# Patient Record
Sex: Female | Born: 1985 | Race: White | Hispanic: No | Marital: Single | Smoking: Never smoker
Health system: Southern US, Community
[De-identification: ages and names within clinical notes are randomized; demographics above are authoritative.]

## PROBLEM LIST (undated history)

## (undated) DIAGNOSIS — J45909 Unspecified asthma, uncomplicated: Secondary | ICD-10-CM

## (undated) DIAGNOSIS — R569 Unspecified convulsions: Secondary | ICD-10-CM

## (undated) DIAGNOSIS — G35 Multiple sclerosis: Secondary | ICD-10-CM

## (undated) DIAGNOSIS — Z8669 Personal history of other diseases of the nervous system and sense organs: Secondary | ICD-10-CM

## (undated) HISTORY — DX: Unspecified convulsions: R56.9

## (undated) HISTORY — DX: Unspecified asthma, uncomplicated: J45.909

## (undated) HISTORY — DX: Personal history of other diseases of the nervous system and sense organs: Z86.69

## (undated) HISTORY — DX: Multiple sclerosis: G35

---

## 2013-01-27 ENCOUNTER — Encounter: Payer: Self-pay | Admitting: Neurology

## 2013-01-29 ENCOUNTER — Encounter: Payer: Self-pay | Admitting: Neurology

## 2013-01-29 ENCOUNTER — Ambulatory Visit (INDEPENDENT_AMBULATORY_CARE_PROVIDER_SITE_OTHER): Payer: BC Managed Care – PPO | Admitting: Neurology

## 2013-01-29 ENCOUNTER — Encounter (INDEPENDENT_AMBULATORY_CARE_PROVIDER_SITE_OTHER): Payer: Self-pay

## 2013-01-29 VITALS — BP 114/68 | HR 72 | Ht 63.5 in | Wt 127.0 lb

## 2013-01-29 DIAGNOSIS — R569 Unspecified convulsions: Secondary | ICD-10-CM

## 2013-01-29 DIAGNOSIS — Z5181 Encounter for therapeutic drug level monitoring: Secondary | ICD-10-CM

## 2013-01-29 DIAGNOSIS — G35 Multiple sclerosis: Secondary | ICD-10-CM

## 2013-01-29 NOTE — Progress Notes (Signed)
Reason for visit: Multiple sclerosis  Allison Silva is a 27 y.o. female  History of present illness:  Allison Silva is a 27 year old right-handed white female with a history of multiple sclerosis that was diagnosed in 2010. The patient had been having some symptoms in retrospect since her freshman year in high school. The patient presented with right arm and right leg numbness. The patient was found to have multiple white matter lesions that were enhancing in the brain, with black holes. The patient was initially placed on Rebif, but she did not tolerate the medication. The patient subsequently was placed on Aubagio, and the patient has been on this medication until the summer of 2014. The patient ran out of medication, and she has been off of all disease modifying agents since that time. The patient is not using birth control, and she was not aware of any problems with pregnancy while on Aubagio. The patient has done fairly well over the last 2 years. In January 2013, the patient had a seizure event that was felt associated with a relapse of her multiple sclerosis. The patient had been on Ampyra, but she had stopped that medication 6 months prior to her seizure. The patient was placed on Keppra, but once again, the patient stopped the Keppra sometime within the last year. The patient has not had any seizure recurrence, and she indicates that she is operating a motor vehicle. The patient indicates that in the past, she has had an episode of right optic neuritis. The patient does have some fatigue that worsens with exercise or overheating. The patient otherwise is able to do any activities that she wishes. Within the last month, the patient has had some mild numbness involving the right fourth and fifth fingers. The patient indicates that when she had her seizure, she was somewhat confused going into the seizure, and then had a witnessed generalized tonic-clonic event. The patient went to the hospital,  and had a second event in the hospital. The last MRI of the brain was done in January 2013. At that time, the patient had multiple oval and round lesions with an enhancing lesion in the left parietal lobe with evidence of black holes. The patient also had spinal cord lesions. An EEG study done in January 2013 showed focal slow-wave activity over the right posterior temporal region. The patient has recently moved to this area, and she comes to this office for an evaluation. The patient is unaware that she has ever been checked for the JC virus antibody.  Past Medical History  Diagnosis Date  . Multiple sclerosis   . Seizures   . Asthma   . History of optic neuritis     Right    History reviewed. No pertinent past surgical history.  History reviewed. No pertinent family history.  Social history:  reports that she has never smoked. She has never used smokeless tobacco. She reports that she drinks alcohol. She reports that she does not use illicit drugs.  Medications:  No current outpatient prescriptions on file prior to visit.   No current facility-administered medications on file prior to visit.     No Known Allergies  ROS:  Out of a complete 14 system review of symptoms, the patient complains only of the following symptoms, and all other reviewed systems are negative.  Fatigue, palpitations Feeling hot, cold, increased thirst Memory loss, numbness Sleepiness  Blood pressure 114/68, pulse 72, height 5' 3.5" (1.613 m), weight 127 lb (57.607 kg).  Physical  Exam  General: The patient is alert and cooperative at the time of the examination.  Head: Pupils are equal, round, and reactive to light. Discs are flat bilaterally.  Neck: The neck is supple, no carotid bruits are noted.  Respiratory: The respiratory examination is clear.  Cardiovascular: The cardiovascular examination reveals a regular rate and rhythm, no obvious murmurs or rubs are noted.  Skin: Extremities are  without significant edema.  Neurologic Exam  Mental status: The patient is alert and oriented x 3 at the time of the examination.  Cranial nerves: Facial symmetry is present. There is good sensation of the face to pinprick and soft touch bilaterally. The strength of the facial muscles and the muscles to head turning and shoulder shrug are normal bilaterally. Speech is well enunciated, no aphasia or dysarthria is noted. Extraocular movements are full. Visual fields are full.  Motor: The motor testing reveals 5 over 5 strength of all 4 extremities. Good symmetric motor tone is noted throughout.  Sensory: Sensory testing is intact to pinprick, soft touch, vibration sensation, and position sense on all 4 extremities. No evidence of extinction is noted.  Coordination: Cerebellar testing reveals good finger-nose-finger and heel-to-shin bilaterally.  Gait and station: Gait is normal. Tandem gait is normal. Romberg is negative. No drift is seen.  Reflexes: Deep tendon reflexes are symmetric and normal bilaterally. Toes are downgoing bilaterally.   Assessment/Plan:  1. Multiple sclerosis  2. History of seizures  On prior examinations, the patient has had significant abnormalities by MRI, but her clinical examination is relatively unremarkable. MRI studies had shown evidence of significant disease activity, with "black holes", which offers a risk for disability and dementia in the future. The patient should be on a more aggressive therapy. Aubagio should not be used in an individual in the childbearing age unless absolutely necessary. Aubagio does not represent an aggressive therapy for multiple sclerosis. The patient will be considered for Gilenya or for Tysabri. Blood work will be done today. The patient will followup in 4-6 weeks. MRI evaluation of the brain and cervical spine will be done with and without contrast. EEG evaluation will be done. For now, the patient will remain off of the Keppra as  she has done well without medical therapy. If any further seizure-type events occur, the patient is to contact me immediately. The patient is on vitamin D therapy.  Marlan Palau MD 01/30/2013 8:36 AM  [facility] [address] [place], [place] [address]  Phone [phone] Fax [phone]

## 2013-01-29 NOTE — Patient Instructions (Signed)
Multiple Sclerosis Multiple sclerosis (MS) is a disease of the central nervous system. Its cause is unknown. It is more common in the northern states than in the southern states. There is a higher incidence of MS in women. There is a wide variation in the symptoms (problems) of MS. This is because of the many different ways it affects the central nervous system. It often comes on in episodes or attacks. These attacks may last weeks to months. There may be long periods of nearly no problems between attacks. The main symptoms include visual problems (associated with eye pain), numbness, weakness, and paralysis in extremities (arms/hands and legs/feet). There may also be tremors and problems with balance and walking. The age when MS starts is variable. Advances in medicine continue to improve the treatment of this illness. There is no known cure for MS but there are medications that help. MS is not an inherited illness, although your risk of getting this disease is higher if you have a relative with MS. The best radiologic (x-ray) study for MS is an MRI (magnetic resonance imaging). There are medications available to decrease the number and frequency of attacks. SYMPTOMS  The symptoms of MS are caused by loss of insulation (myelin) of the nerves of the brain. When this happens, brain signals do not get transmitted properly or may not get transmitted at all. Some of the problems caused by this include:   Numbness.  Weakness.  Paralysis in extremities.  Visual problems, eye pain.  Balance problems.  Tremors. DIAGNOSIS  Your caregiver can do studies on you to make this diagnosis. This may include specialized X-rays and spinal fluid studies. HOME CARE INSTRUCTIONS   Take medications as directed by your caregiver. Baclofen is a drug commonly used to reduce muscle spasticity. Steroids are often used for short term relief.  Exercise as directed.  Use physical and occupational therapy as directed by  your caregiver. Careful attention to this medical care can help avoid depression.  See your caregiver if you begin to have problems with depression. This is a common problem in MS. Patients often continue to work many years after the diagnosis of MS. Document Released: 01/26/2000 Document Revised: 04/22/2011 Document Reviewed: 09/03/2006 Exit[employer] Patient Information 2014 [employer].  

## 2013-02-02 LAB — CBC WITH DIFFERENTIAL
Basophils Absolute: 0.1 10*3/uL (ref 0.0–0.2)
Eos: 1 %
Eosinophils Absolute: 0 10*3/uL (ref 0.0–0.4)
HCT: 37.1 % (ref 34.0–46.6)
Immature Grans (Abs): 0 10*3/uL (ref 0.0–0.1)
Immature Granulocytes: 0 %
Lymphs: 28 %
Monocytes: 7 %
Neutrophils Absolute: 3.5 10*3/uL (ref 1.4–7.0)
Neutrophils Relative %: 63 %
Platelets: 255 10*3/uL (ref 150–379)
RDW: 15.1 % (ref 12.3–15.4)
WBC: 5.6 10*3/uL (ref 3.4–10.8)

## 2013-02-02 LAB — COMPREHENSIVE METABOLIC PANEL

## 2013-02-02 LAB — VARICELLA ZOSTER ANTIBODY, IGG

## 2013-02-05 ENCOUNTER — Telehealth: Payer: Self-pay | Admitting: Neurology

## 2013-02-05 NOTE — Telephone Encounter (Signed)
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

## 2013-02-15 ENCOUNTER — Ambulatory Visit (INDEPENDENT_AMBULATORY_CARE_PROVIDER_SITE_OTHER): Payer: BC Managed Care – PPO | Admitting: Radiology

## 2013-02-15 ENCOUNTER — Telehealth: Payer: Self-pay | Admitting: Neurology

## 2013-02-15 ENCOUNTER — Other Ambulatory Visit: Payer: Self-pay | Admitting: Neurology

## 2013-02-15 DIAGNOSIS — R569 Unspecified convulsions: Secondary | ICD-10-CM

## 2013-02-15 NOTE — Procedures (Signed)
    History:  Allison Silva is a 28 year old patient with a history of multiple sclerosis diagnosed in 2010. In January 2013, the patient had a seizure event that was felt to be associated with an MS exacerbation. The patient was placed on Keppra, but she has stopped the medication within the last year. No further episodes have been noted. The patient is being reevaluated for the seizures.  This is a routine EEG. No skull defects are noted. Medications include albuterol and vitamin D.   EEG classification: Normal awake and asleep  Description of the recording: The background rhythms of this recording consists of a fairly well modulated medium amplitude background activity of 11 Hz. As the record progresses, the patient initially is in the waking state, but appears to enter the early stage II sleep during the recording, with rudimentary sleep spindles and vertex sharp wave activity seen. During the wakeful state, photic stimulation is performed, and this results in a bilateral and symmetric photic driving response. Hyperventilation was not performed. At no time during the recording does there appear to be evidence of spike or spike wave discharges or evidence of focal slowing. EKG monitor shows no evidence of cardiac rhythm abnormalities with a heart rate of 72.  Impression: This is a normal EEG recording in the waking and sleeping state. No evidence of ictal or interictal discharges were seen at any time during the recording.

## 2013-02-15 NOTE — Telephone Encounter (Signed)
I called the patient. The EEG study done today was completed normal in the waking and sleeping state. We will not place the patient back on seizure medications at this time. The last lost some of the blood work that was drawn previously, the patient will need to come back in for a comprehensive metabolic profile, and a varicella-zoster antibody level. I will call the patient when I get the results of the MRI of the brain and cervical spine. 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

## 2013-02-16 ENCOUNTER — Telehealth: Payer: Self-pay | Admitting: Neurology

## 2013-02-16 LAB — COMPREHENSIVE METABOLIC PANEL
A/G RATIO: 2.1 (ref 1.1–2.5)
ALBUMIN: 4.7 g/dL (ref 3.5–5.5)
ALK PHOS: 36 IU/L — AB (ref 39–117)
ALT: 12 IU/L (ref 0–32)
AST: 18 IU/L (ref 0–40)
BILIRUBIN TOTAL: 0.4 mg/dL (ref 0.0–1.2)
BUN / CREAT RATIO: 11 (ref 8–20)
BUN: 12 mg/dL (ref 6–20)
CO2: 24 mmol/L (ref 18–29)
CREATININE: 1.07 mg/dL — AB (ref 0.57–1.00)
Calcium: 9.6 mg/dL (ref 8.7–10.2)
Chloride: 100 mmol/L (ref 97–108)
GFR, EST AFRICAN AMERICAN: 83 mL/min/{1.73_m2} (ref >59)
GFR, EST NON AFRICAN AMERICAN: 72 mL/min/{1.73_m2} (ref >59)
Globulin, Total: 2.2 g/dL (ref 1.5–4.5)
Glucose: 85 mg/dL (ref 65–99)
Potassium: 4.2 mmol/L (ref 3.5–5.2)
Sodium: 141 mmol/L (ref 134–144)
Total Protein: 6.9 g/dL (ref 6.0–8.5)

## 2013-02-16 LAB — VARICELLA ZOSTER ANTIBODY, IGG: VARICELLA: 731 {index} (ref >165)

## 2013-02-16 NOTE — Telephone Encounter (Signed)
I called patient. The blood work was unremarkable, the varicella zoster antibody was adequate. The patient is to get an ophthalmologic evaluation, and I'll have her come in to sign the form for Gilenya. The patient will be getting the MRI evaluations in the next several days.

## 2013-02-20 ENCOUNTER — Ambulatory Visit
Admission: RE | Admit: 2013-02-20 | Discharge: 2013-02-20 | Disposition: A | Discharge status: Home / Self Care | Payer: BC Managed Care – PPO | Source: Ambulatory Visit | Attending: Neurology | Admitting: Neurology

## 2013-02-20 DIAGNOSIS — G35 Multiple sclerosis: Secondary | ICD-10-CM

## 2013-02-20 MED ORDER — GADOBENATE DIMEGLUMINE 529 MG/ML IV SOLN
10.0000 mL | Freq: Once | INTRAVENOUS | Status: AC | PRN
  Administered 2013-02-20: 10 mL via INTRAVENOUS

## 2013-02-23 ENCOUNTER — Telehealth: Payer: Self-pay | Admitting: Neurology

## 2013-02-23 NOTE — Telephone Encounter (Signed)
I called patient. The MRI of the brain shows bifrontal enhancing lesions. There are spinal cord lesions as well, I do not see any enhancement. I discussed this with the patient. The patient needs to get an ophthalmologic evaluation. The patient will have a revisit today some now, we will get her to sign the form for Gilenya. I'll make a referral to an ophthalmologist at that time.  IMPRESSION:  Abnormal MRI brain (with and without) demonstrating:  1. Acute demyelinating plaques in the left frontal juxtacortical and right frontal subcortical regions, with partial rim enhancement.  2. Multiple periventricular and subcortical chronic demyelinating plaques.

## 2013-02-26 ENCOUNTER — Ambulatory Visit (INDEPENDENT_AMBULATORY_CARE_PROVIDER_SITE_OTHER): Payer: BC Managed Care – PPO | Admitting: Neurology

## 2013-02-26 ENCOUNTER — Encounter: Payer: Self-pay | Admitting: Neurology

## 2013-02-26 VITALS — BP 124/71 | HR 89 | Wt 128.0 lb

## 2013-02-26 DIAGNOSIS — G35 Multiple sclerosis: Secondary | ICD-10-CM

## 2013-02-26 NOTE — Patient Instructions (Signed)
Multiple Sclerosis  Multiple sclerosis (MS) is a disease of the central nervous system. It leads to loss of the insulating covering of the nerves (myelin sheath) of your brain. When this happens, brain signals do not get transmitted properly or may not get transmitted at all. The symptoms of MS occur in episodes or attacks. These attacks may last weeks to months. There may be long periods of nearly no problems between attacks. The age of onset of MS varies.   CAUSES  The cause of MS is unknown. However, it is more common in the northern [place] than in the southern [place].  RISK FACTORS  There is a higher incidence of MS in women than in men. MS is not an inherited illness, although your risk of MS is higher if you have a relative with MS.  SIGNS AND SYMPTOMS   The symptoms of MS occur in episodes or attacks. These attacks may last weeks to months. There may be long periods of almost no symptoms between attacks.  The symptoms of MS vary. This is because of the many different ways it affects the central nervous system. The main symptoms of MS include:   Vision problems and eye pain.   Numbness.   Weakness.   Paralysis in your arms, hands, feet, and legs (extremities).   Balance problems.   Tremors.  DIAGNOSIS   Your health care provider can diagnose MS with the help of imaging exams and lab tests. These may include specialized X-ray exams and spinal fluid tests. The best imaging exam to confirm a diagnosis of MS is MRI.  TREATMENT   There is no known cure for MS, but there are medicines that can decrease the number and frequency of attacks. Steroids are often used for short-term relief. Physical and occupational therapy may also help.  HOME CARE INSTRUCTIONS    Take medicines as directed by your health care provider.   Exercise as directed by your health care provider.  SEEK MEDICAL CARE IF:  You begin to feel depressed.  SEEK IMMEDIATE MEDICAL CARE IF:   You develop paralysis.   You develop  problems with bladder, bowel, or sexual function.   You develop mental changes, such as forgetfulness or mood swings.   You have a seizure.  Document Released: 01/26/2000 Document Revised: 11/18/2012 Document Reviewed: 10/05/2012  ExitCare Patient Information 2014 [employer].

## 2013-02-26 NOTE — Progress Notes (Signed)
Reason for visit: Multiple sclerosis  Allison Silva is an 28 y.o. female  History of present illness:  Allison Silva is a 28 year old right-handed white female with a history of multiple sclerosis. The patient has had a seizure event in the past as well. The patient comes in today for reevaluation. The patient has not had any overt MS attacks since she was seen last. The patient does report some cognitive issues, and some memory problems. The patient denies any numbness, weakness, fatigue problems, visual changes, or problems controlling the bowels or the bladder. The patient denies any balance issues. The patient is not on disease modifying therapies, and she is being considered for Gilenya. The patient has had a recent MRI of the brain that shows several enhancing lesions in the frontal areas. The patient has spinal cord lesions as well, but no enhancing lesions are noted. The patient has had blood work done recently.  Past Medical History  Diagnosis Date  . Multiple sclerosis   . Seizures   . Asthma   . History of optic neuritis     Right    History reviewed. No pertinent past surgical history.  History reviewed. No pertinent family history.  Social history:  reports that she has never smoked. She has never used smokeless tobacco. She reports that she drinks alcohol. She reports that she does not use illicit drugs.   No Known Allergies  Medications:  Current Outpatient Prescriptions on File Prior to Visit  Medication Sig Dispense Refill  . albuterol (PROVENTIL HFA;VENTOLIN HFA) 108 (90 BASE) MCG/ACT inhaler Inhale 2 puffs into the lungs every 6 (six) hours as needed for wheezing or shortness of breath.      . cholecalciferol (VITAMIN D) 1000 UNITS tablet Take 1,000 Units by mouth 2 (two) times daily.       No current facility-administered medications on file prior to visit.    ROS:  Out of a complete 14 system review of symptoms, the patient complains only of the  following symptoms, and all other reviewed systems are negative.  Heat intolerance Memory loss   Blood pressure 124/71, pulse 89, weight 128 lb (58.06 kg), last menstrual period 02/15/2013.  Physical Exam  General: The patient is alert and cooperative at the time of the examination.  Skin: No significant peripheral edema is noted.   Neurologic Exam  Mental status: The patient is oriented x 3. Mini-Mental status examination done today shows a total score of 30/30.  Cranial nerves: Facial symmetry is present. Speech is normal, no aphasia or dysarthria is noted. Extraocular movements are full. Visual fields are full. Pupils are equal, round, and reactive to light. Discs are flat bilaterally.  Motor: The patient has good strength in all 4 extremities.  Sensory examination: Soft touch sensation on the face, arms, and legs is symmetric.  Coordination: The patient has good finger-nose-finger and heel-to-shin bilaterally.  Gait and station: The patient has a normal gait. Tandem gait is normal. Romberg is negative. No drift is seen.  Reflexes: Deep tendon reflexes are symmetric.   MRI brain 01/29/2013:  IMPRESSION:  Abnormal MRI brain (with and without) demonstrating:  1. Acute demyelinating plaques in the left frontal juxtacortical and right frontal subcortical regions, with partial rim enhancement.  2. Multiple periventricular and subcortical chronic demyelinating plaques.    MRI cervical spine 01/29/2013:   IMPRESSION:  Abnormal MRI cervical spine (with and without) demonstrating:  1. Hazy T2 spinal cord hyperintensities at C2-3, C4 and C5-6, consistent with chronic  demyelinating disease.  2. No acute plaques.     Assessment/Plan:  1. Multiple sclerosis  2. History seizures  The patient is doing well at this point, but MRI the brain suggests active demyelination. The patient needs to get on a disease modifying agent. The patient will undergo an ophthalmologic  evaluation, and she will be set up for Gilenya therapy. The patient has signed the form today. The patient followup in 3-4 months.  Marlan Palau. Keith Willis MD 02/26/2013 7:31 PM  [facility] [address] [place], [place] [id number]  Phone [phone] Fax [phone]

## 2013-03-05 ENCOUNTER — Encounter: Payer: Self-pay | Admitting: Neurology

## 2013-03-23 ENCOUNTER — Other Ambulatory Visit: Payer: Self-pay | Admitting: Neurology

## 2013-03-23 DIAGNOSIS — G35 Multiple sclerosis: Secondary | ICD-10-CM

## 2013-03-31 ENCOUNTER — Telehealth: Payer: Self-pay | Admitting: Neurology

## 2013-03-31 DIAGNOSIS — G35 Multiple sclerosis: Secondary | ICD-10-CM

## 2013-03-31 NOTE — Telephone Encounter (Signed)
The patient has had an ophthalmologic evaluation, no abnormalities seen, no contraindication to use of Gilenya. We will need to make a referral to cardiology to initiate the Gilenya therapy.

## 2013-04-01 NOTE — Telephone Encounter (Signed)
Referral to Cardiology Gilenya therapy

## 2013-04-02 ENCOUNTER — Encounter: Payer: Self-pay | Admitting: Neurology

## 2013-04-11 ENCOUNTER — Telehealth: Payer: Self-pay

## 2013-04-11 NOTE — Telephone Encounter (Signed)
[employer] sent [place] a letter saying they have approved our request for coverage on Gilenya effective until 04/09/2014 Ref # [id number]

## 2013-04-14 ENCOUNTER — Telehealth: Payer: Self-pay | Admitting: Neurology

## 2013-04-14 NOTE — Telephone Encounter (Signed)
Prior Berkley Harveyauth has already been approved, please see note from 03/01.  I called back.  Got no answer.  Left message on Candace's voicemail.

## 2013-04-14 NOTE — Telephone Encounter (Signed)
Allison CornfieldStephanie with Gilenya [employer] called and stated that a prior authorization is needed by [employer] for the Gilenya.  This can be done by phone at [id number]. If you have any questions or concerns please call Allison Silva at [phone] (her direct line).  Thank you

## 2013-04-28 ENCOUNTER — Encounter: Payer: Self-pay | Admitting: Neurology

## 2013-05-03 ENCOUNTER — Telehealth: Payer: Self-pay | Admitting: Neurology

## 2013-05-03 NOTE — Telephone Encounter (Signed)
Allison Silva, Program is calling to inquire about FDO clearance.  Please advise.  Thank you.

## 2013-05-03 NOTE — Telephone Encounter (Signed)
Spoke to [place] from [place] and relayed that the patient has been cleared for her FDO.

## 2013-05-03 NOTE — Telephone Encounter (Signed)
Lelon Mast wanted to know if patient is cleared for first dose observation--please call-thank you.

## 2013-06-07 ENCOUNTER — Telehealth: Payer: Self-pay | Admitting: Neurology

## 2013-06-07 NOTE — Telephone Encounter (Signed)
Victorino Dike [phone] calling regarding Gilenya authorization/refills for patient. Reference # [id number]

## 2013-06-07 NOTE — Telephone Encounter (Signed)
I called back.  Spoke with [employer].  After holding for several minutes, she transferred me to [facility].  At this point, the call disconnected.  I called back again.  Spoke with Raquel Sarna.  She said the package size fr Gilenya changed from 28 to 30 and they wanted to know if it was okay to change the Rx to #30.  I advised her this was okay.  They will proceed with refill.

## 2013-08-17 ENCOUNTER — Ambulatory Visit (INDEPENDENT_AMBULATORY_CARE_PROVIDER_SITE_OTHER): Payer: BC Managed Care – PPO | Admitting: Neurology

## 2013-08-17 ENCOUNTER — Telehealth: Payer: Self-pay | Admitting: Neurology

## 2013-08-17 ENCOUNTER — Encounter: Payer: Self-pay | Admitting: Neurology

## 2013-08-17 VITALS — BP 111/79 | HR 68 | Wt 124.0 lb

## 2013-08-17 DIAGNOSIS — R569 Unspecified convulsions: Secondary | ICD-10-CM

## 2013-08-17 DIAGNOSIS — G35D Multiple sclerosis, unspecified: Secondary | ICD-10-CM

## 2013-08-17 DIAGNOSIS — G35 Multiple sclerosis: Secondary | ICD-10-CM

## 2013-08-17 DIAGNOSIS — Z5181 Encounter for therapeutic drug level monitoring: Secondary | ICD-10-CM

## 2013-08-17 LAB — CBC WITH DIFFERENTIAL
BASOS ABS: 0 10*3/uL (ref 0.0–0.2)
BASOS: 1 %
Eos: 6 %
Eosinophils Absolute: 0.1 10*3/uL (ref 0.0–0.4)
HEMATOCRIT: 35.5 % (ref 34.0–46.6)
Hemoglobin: 11.5 g/dL (ref 11.1–15.9)
LYMPHS: 6 %
Lymphocytes Absolute: 0.1 10*3/uL — ABNORMAL LOW (ref 0.7–3.1)
MCH: 26.9 pg (ref 26.6–33.0)
MCHC: 32.4 g/dL (ref 31.5–35.7)
MCV: 83 fL (ref 79–97)
MONOS ABS: 0.4 10*3/uL (ref 0.1–0.9)
Monocytes: 19 %
NEUTROS ABS: 1.3 10*3/uL — AB (ref 1.4–7.0)
Neutrophils Relative %: 68 %
Platelets: 229 10*3/uL (ref 150–379)
RBC: 4.28 x10E6/uL (ref 3.77–5.28)
RDW: 14.8 % (ref 12.3–15.4)
WBC: 1.9 10*3/uL — CL (ref 3.4–10.8)

## 2013-08-17 LAB — COMPREHENSIVE METABOLIC PANEL
A/G RATIO: 1.9 (ref 1.1–2.5)
ALT: 13 IU/L (ref 0–32)
AST: 19 IU/L (ref 0–40)
Albumin: 4.3 g/dL (ref 3.5–5.5)
Alkaline Phosphatase: 40 IU/L (ref 39–117)
BILIRUBIN TOTAL: 0.3 mg/dL (ref 0.0–1.2)
BUN/Creatinine Ratio: 15 (ref 8–20)
BUN: 15 mg/dL (ref 6–20)
CO2: 28 mmol/L (ref 18–29)
CREATININE: 0.99 mg/dL (ref 0.57–1.00)
Calcium: 9.1 mg/dL (ref 8.7–10.2)
Chloride: 102 mmol/L (ref 96–108)
GFR, EST AFRICAN AMERICAN: 92 mL/min/{1.73_m2} (ref >59)
GFR, EST NON AFRICAN AMERICAN: 79 mL/min/{1.73_m2} (ref >59)
GLOBULIN, TOTAL: 2.3 g/dL (ref 1.5–4.5)
Glucose: 85 mg/dL (ref 65–99)
Potassium: 4.4 mmol/L (ref 3.5–5.2)
SODIUM: 137 mmol/L (ref 134–144)
TOTAL PROTEIN: 6.6 g/dL (ref 6.0–8.5)

## 2013-08-17 NOTE — Patient Instructions (Signed)

## 2013-08-17 NOTE — Progress Notes (Signed)
Reason for visit: Multiple sclerosis  Sheppard PentonBrittany Silva is an 28 y.o. female  History of present illness:  [name] is a 28 year old right-handed white female with a history of multiple sclerosis. She has had a seizure event in the past as well. She reports no further seizure episodes, and she is operating a motor vehicle. The patient has been switched to Gilenya, and she indicates that overall she is tolerating this well. Since she has been on the medication, she has been having episodes of some chest pain that occurs upon awakening in the morning. It is associated with a bruised-type feeling in the center of the chest that goes away as soon as she gets up. She had some palpitations of the heart within the first 2 weeks of starting the medication, but this is no longer a problem. She indicates that she is quite active, and she will run on a regular basis. She may have some transient numbness of the legs following her exercise, but this is not a persistent feature. No new events of numbness, weakness, bladder control problems, or visual problems have been noted. She returns for an evaluation. She does report some ongoing mild cognitive processing issues. This is unchanged.  Past Medical History  Diagnosis Date  . Multiple sclerosis   . Seizures   . Asthma   . History of optic neuritis     Right    History reviewed. No pertinent past surgical history.  History reviewed. No pertinent family history.  Social history:  reports that she has never smoked. She has never used smokeless tobacco. She reports that she drinks alcohol. She reports that she does not use illicit drugs.   No Known Allergies  Medications:  Current Outpatient Prescriptions on File Prior to Visit  Medication Sig Dispense Refill  . albuterol (PROVENTIL HFA;VENTOLIN HFA) 108 (90 BASE) MCG/ACT inhaler Inhale 2 puffs into the lungs every 6 (six) hours as needed for wheezing or shortness of breath.      .  cholecalciferol (VITAMIN D) 1000 UNITS tablet Take 1,000 Units by mouth 2 (two) times daily.       No current facility-administered medications on file prior to visit.    ROS:  Out of a complete 14 system review of symptoms, the patient complains only of the following symptoms, and all other reviewed systems are negative.  Chest pain Heat intolerance Memory disturbance Numbness  Blood pressure 111/79, pulse 68, weight 124 lb (56.246 kg).  Physical Exam  General: The patient is alert and cooperative at the time of the examination.  Skin: No significant peripheral edema is noted.   Neurologic Exam  Mental status: The patient is oriented x 3.  Cranial nerves: Facial symmetry is present. Speech is normal, no aphasia or dysarthria is noted. Extraocular movements are full. Visual fields are full. Pupils are equal, round, and reactive to light. Discs are flat bilaterally.  Motor: The patient has good strength in all 4 extremities.  Sensory examination: Soft touch sensation is symmetric on the face, arms, and legs.  Coordination: The patient has good finger-nose-finger and heel-to-shin bilaterally.  Gait and station: The patient has a normal gait. Tandem gait is normal. Romberg is negative. No drift is seen.  Reflexes: Deep tendon reflexes are symmetric.   Assessment/Plan:  One. Multiple sclerosis  2. History of seizure  The patient is doing well at this time on Gilenya. She is to continue her medication. The chest pain occurs with recumbency, and may be associated  with reflux. The patient is to take Prilosec in the evening hours to see if this improves the discomfort. She will followup in 6 months. She will have blood work done today. She is to have her ophthalmologic evaluation done again in one month, she has been on Gilenya for 2 months now.  Marlan Palau MD 08/17/2013 8:12 PM  [facility] [address] [place], [place]  [address]  Phone [phone] Fax [phone]

## 2013-08-17 NOTE — Telephone Encounter (Signed)
I called patient. The comprehensive metabolic profile is unremarkable. The CBC shows a white count of 1.9, and the lymphocyte count is 0.1. We will follow the patient for this, recheck in 4-6 weeks.

## 2013-09-28 ENCOUNTER — Telehealth: Payer: Self-pay | Admitting: Neurology

## 2013-09-28 DIAGNOSIS — Z5181 Encounter for therapeutic drug level monitoring: Secondary | ICD-10-CM

## 2013-09-28 NOTE — Telephone Encounter (Signed)
I called the patient to come in and get a CBC and differential done. The last study done well on Gilenya showed a low lymphocyte count of 0.1, we will repeat this study.

## 2013-10-07 ENCOUNTER — Other Ambulatory Visit (INDEPENDENT_AMBULATORY_CARE_PROVIDER_SITE_OTHER): Payer: Self-pay

## 2013-10-07 DIAGNOSIS — Z5181 Encounter for therapeutic drug level monitoring: Secondary | ICD-10-CM

## 2013-10-07 DIAGNOSIS — Z0289 Encounter for other administrative examinations: Secondary | ICD-10-CM

## 2013-10-08 ENCOUNTER — Telehealth: Payer: Self-pay | Admitting: Neurology

## 2013-10-08 LAB — CBC WITH DIFFERENTIAL
Basophils Absolute: 0 10*3/uL (ref 0.0–0.2)
Basos: 1 %
EOS ABS: 0.1 10*3/uL (ref 0.0–0.4)
Eos: 3 %
HCT: 33.5 % — ABNORMAL LOW (ref 34.0–46.6)
Hemoglobin: 10.7 g/dL — ABNORMAL LOW (ref 11.1–15.9)
IMMATURE GRANULOCYTES: 0 %
Immature Grans (Abs): 0 10*3/uL (ref 0.0–0.1)
Lymphocytes Absolute: 0.2 10*3/uL — ABNORMAL LOW (ref 0.7–3.1)
Lymphs: 11 %
MCH: 27.8 pg (ref 26.6–33.0)
MCHC: 31.9 g/dL (ref 31.5–35.7)
MCV: 87 fL (ref 79–97)
MONOS ABS: 0.4 10*3/uL (ref 0.1–0.9)
Monocytes: 16 %
Neutrophils Absolute: 1.5 10*3/uL (ref 1.4–7.0)
Neutrophils Relative %: 69 %
PLATELETS: 223 10*3/uL (ref 150–379)
RBC: 3.85 x10E6/uL (ref 3.77–5.28)
RDW: 14.6 % (ref 12.3–15.4)
WBC: 2.2 10*3/uL — CL (ref 3.4–10.8)

## 2013-10-08 NOTE — Telephone Encounter (Signed)
I called patient. The white blood count has come up to 2.2, and the total lymphocyte count is 0.2, which is acceptable on Gilenya. We'll recheck the blood work in 3 months.

## 2013-12-06 ENCOUNTER — Emergency Department: Payer: Self-pay | Admitting: Emergency Medicine

## 2013-12-06 LAB — CBC WITH DIFFERENTIAL/PLATELET
BASOS PCT: 0.8 %
Basophil #: 0 10*3/uL (ref 0.0–0.1)
Eosinophil #: 0.1 10*3/uL (ref 0.0–0.7)
Eosinophil %: 3.9 %
HCT: 37.1 % (ref 35.0–47.0)
HGB: 11.8 g/dL — AB (ref 12.0–16.0)
Lymphocyte #: 0.2 10*3/uL — ABNORMAL LOW (ref 1.0–3.6)
Lymphocyte %: 5.6 %
MCH: 27.8 pg (ref 26.0–34.0)
MCHC: 31.9 g/dL — AB (ref 32.0–36.0)
MCV: 87 fL (ref 80–100)
Monocyte #: 0.4 x10 3/mm (ref 0.2–0.9)
Monocyte %: 13.7 %
NEUTROS ABS: 2 10*3/uL (ref 1.4–6.5)
NEUTROS PCT: 76 %
Platelet: 210 10*3/uL (ref 150–440)
RBC: 4.25 10*6/uL (ref 3.80–5.20)
RDW: 14.6 % — ABNORMAL HIGH (ref 11.5–14.5)
WBC: 2.7 10*3/uL — ABNORMAL LOW (ref 3.6–11.0)

## 2013-12-06 LAB — URINALYSIS, COMPLETE
BILIRUBIN, UR: NEGATIVE
Blood: NEGATIVE
Glucose,UR: NEGATIVE mg/dL (ref 0–75)
KETONE: NEGATIVE
Nitrite: NEGATIVE
PH: 6 (ref 4.5–8.0)
Protein: NEGATIVE
RBC,UR: <1 /HPF (ref 0–5)
Specific Gravity: 1.011 (ref 1.003–1.030)
WBC UR: <1 /HPF (ref 0–5)

## 2013-12-06 LAB — COMPREHENSIVE METABOLIC PANEL
Albumin: 4 g/dL (ref 3.4–5.0)
Alkaline Phosphatase: 48 U/L
Anion Gap: 8 (ref 7–16)
BUN: 15 mg/dL (ref 7–18)
Bilirubin,Total: 0.3 mg/dL (ref 0.2–1.0)
CALCIUM: 8.6 mg/dL (ref 8.5–10.1)
Chloride: 107 mmol/L (ref 98–107)
Co2: 28 mmol/L (ref 21–32)
Creatinine: 0.94 mg/dL (ref 0.60–1.30)
EGFR (African American): >60
EGFR (Non-African Amer.): >60
GLUCOSE: 96 mg/dL (ref 65–99)
Osmolality: 286 (ref 275–301)
Potassium: 3.9 mmol/L (ref 3.5–5.1)
SGOT(AST): 19 U/L (ref 15–37)
SGPT (ALT): 23 U/L
Sodium: 143 mmol/L (ref 136–145)
Total Protein: 7.5 g/dL (ref 6.4–8.2)

## 2013-12-06 IMAGING — US US PELV - US TRANSVAGINAL
1 series · 14 of 25 positions shown · non-contrast
Comparison: None

CLINICAL DATA: Right lower quadrant pain

EXAM:
TRANSABDOMINAL AND TRANSVAGINAL ULTRASOUND OF PELVIS
TECHNIQUE: Both transabdominal and transvaginal ultrasound examinations of the
pelvis were performed. Transabdominal technique was performed for
global imaging of the pelvis including uterus, ovaries, adnexal
regions, and pelvic cul-de-sac. It was necessary to proceed with
endovaginal exam following the transabdominal exam to visualize the
endometrium and adnexa.

[Series 1: us pelv - us transvaginal · 0.20mm/px · 14 of 56 slices shown]
[im 1/56]
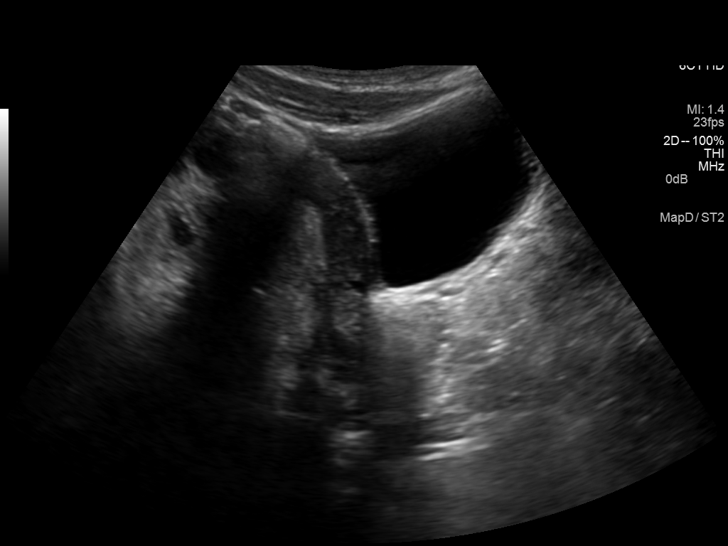
[im 5/56]
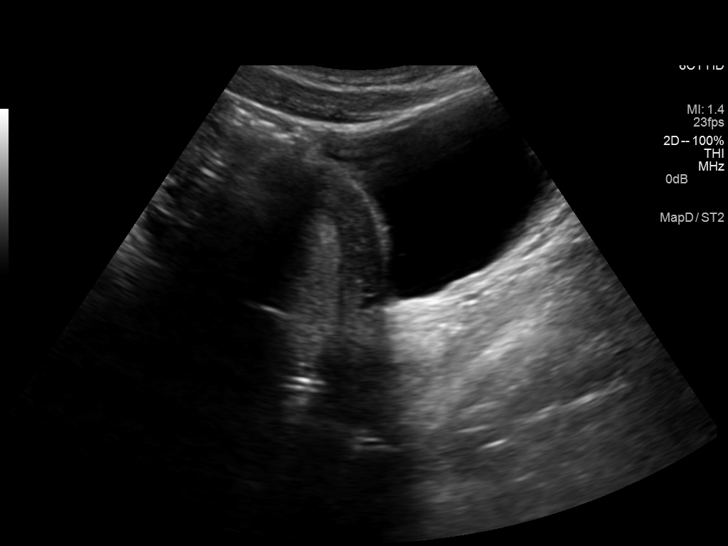
[im 10/56]
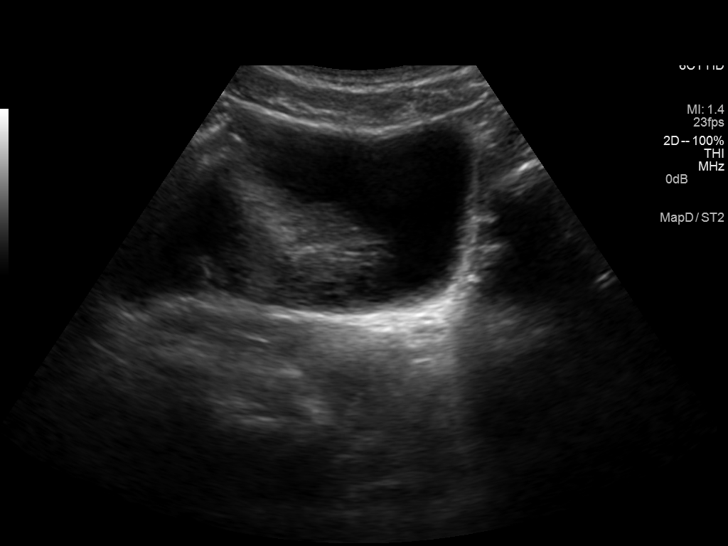
[im 14/56]
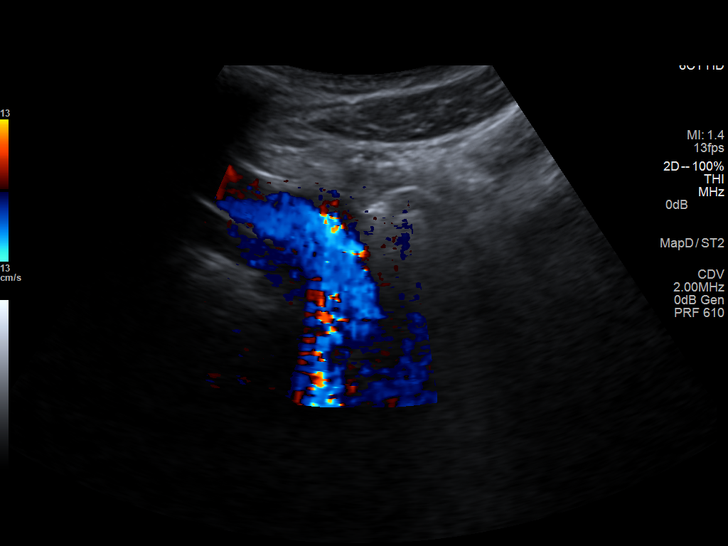
[im 19/56]
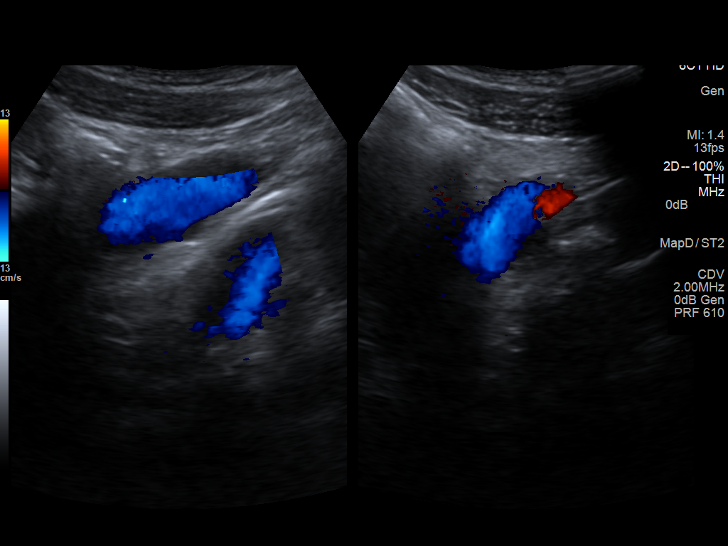
[im 21/56]
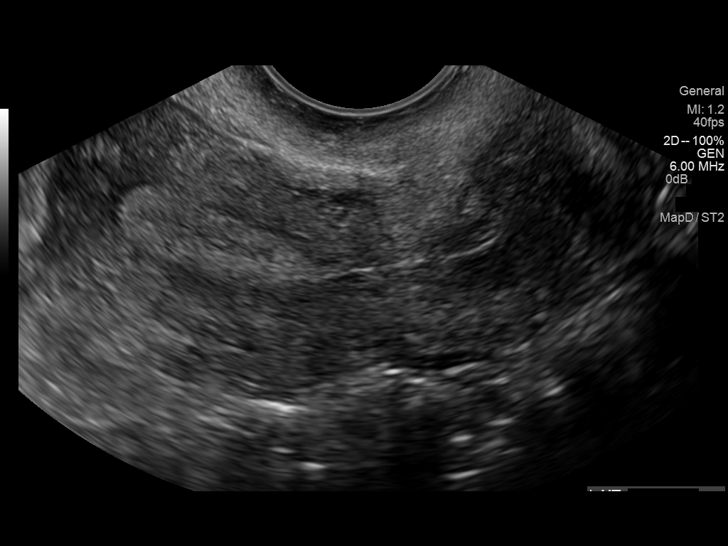
[im 26/56]
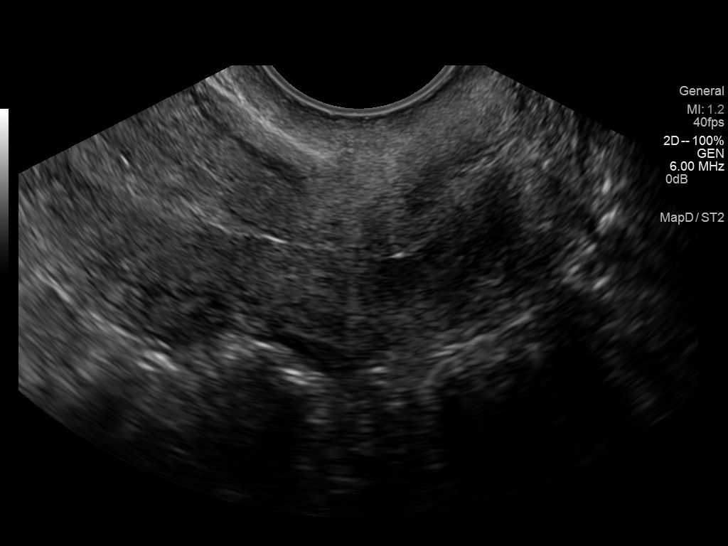
[im 30/56]
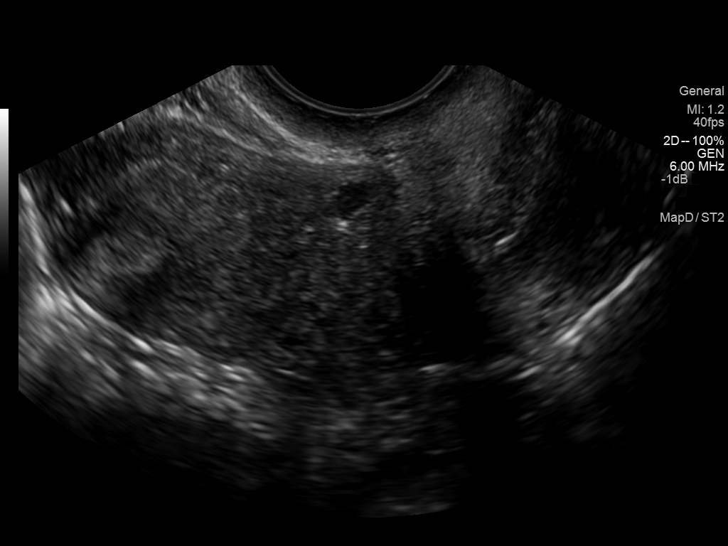
[im 35/56]
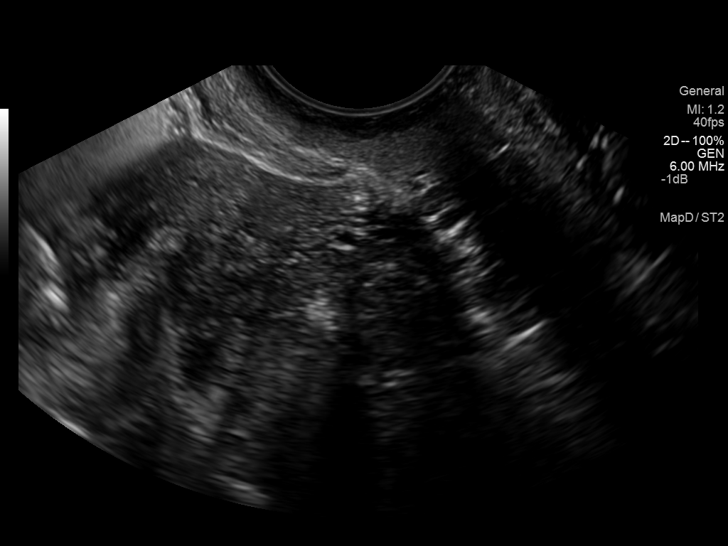
[im 37/56]
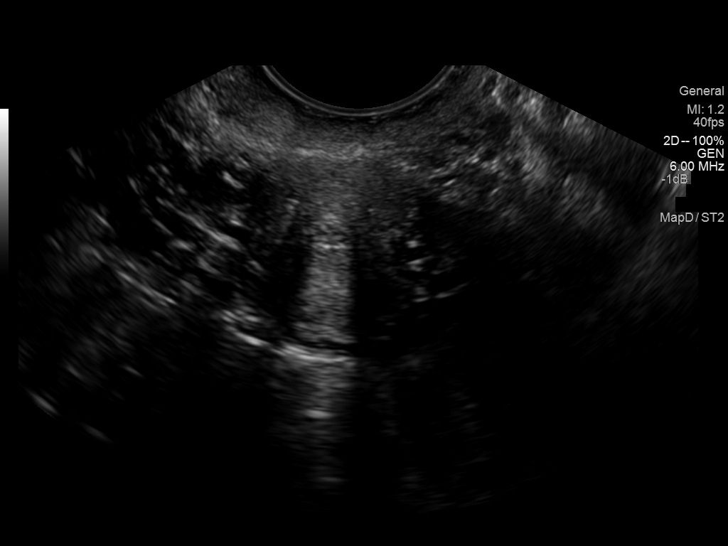
[im 42/56]
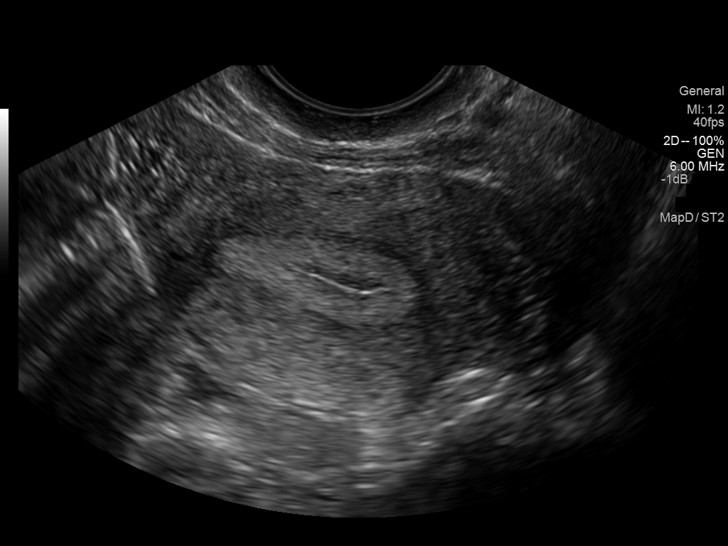
[im 46/56]
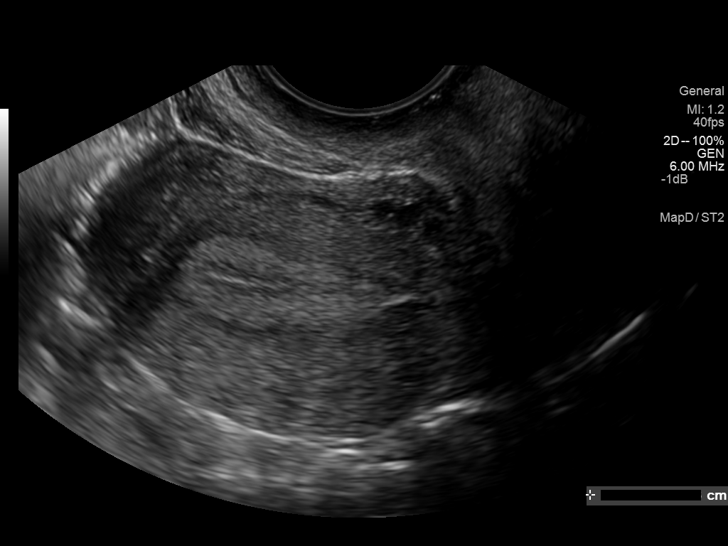
[im 51/56]
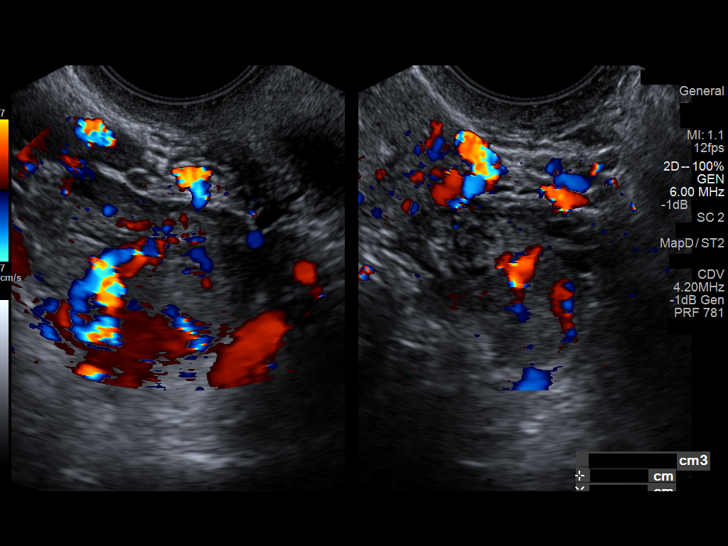
[im 56/56]
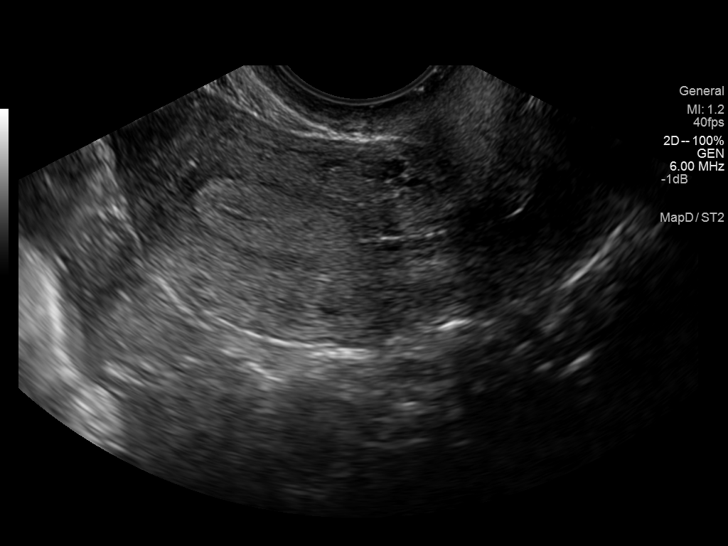

[14 of 25 positions shown; findings below may reference images not displayed]

FINDINGS: Uterus

Measurements: 7.4 x 2.8 x 4.6 cm. No fibroids or other mass
visualized.

Endometrium

Thickness: 11 mm.  No focal abnormality visualized.

Right ovary

Measurements: 3.3 x 2.1 x 1.8 cm. No lesion or adnexal abnormality.

Left ovary

Measurements: 3.4 x 2.0 x 2.7 cm. 1.8 cm rounded near isoechoic area
without definite internal vascularity.

Other findings

No free fluid.
IMPRESSION: No sonographic finding to explain the patient's right lower quadrant
pain.

1.8 cm probable hemorrhagic or involuted corpus luteal cyst on the
left. Recommend 6 week ultrasound follow-up to document resolution.

## 2013-12-07 LAB — WET PREP, GENITAL

## 2013-12-07 LAB — GC/CHLAMYDIA PROBE AMP

## 2014-01-10 ENCOUNTER — Telehealth: Payer: Self-pay | Admitting: Neurology

## 2014-01-10 DIAGNOSIS — Z5181 Encounter for therapeutic drug level monitoring: Secondary | ICD-10-CM

## 2014-01-10 NOTE — Telephone Encounter (Signed)
I called the patient. She is to come in for blood work on Gilenya CBC and CMET.

## 2014-01-10 NOTE — Telephone Encounter (Signed)
-----   Message from York Spaniel, MD sent at 10/08/2013  7:39 AM EDT ----- Recheck CBC, patient on Gilenya

## 2014-02-17 ENCOUNTER — Ambulatory Visit (INDEPENDENT_AMBULATORY_CARE_PROVIDER_SITE_OTHER): Payer: BC Managed Care – PPO | Admitting: Adult Health

## 2014-02-17 ENCOUNTER — Encounter: Payer: Self-pay | Admitting: Adult Health

## 2014-02-17 VITALS — BP 111/65 | HR 73 | Ht 64.0 in | Wt 129.2 lb

## 2014-02-17 DIAGNOSIS — R569 Unspecified convulsions: Secondary | ICD-10-CM

## 2014-02-17 DIAGNOSIS — Z5181 Encounter for therapeutic drug level monitoring: Secondary | ICD-10-CM

## 2014-02-17 DIAGNOSIS — H9191 Unspecified hearing loss, right ear: Secondary | ICD-10-CM

## 2014-02-17 DIAGNOSIS — G35 Multiple sclerosis: Secondary | ICD-10-CM

## 2014-02-17 NOTE — Progress Notes (Signed)
PATIENT: Allison Silva DOB: [birth year]  REASON FOR VISIT: follow up HISTORY FROM: patient CHIEF COMPLAINT: Multiple sclerosis, seizure, hearing loss right side  HISTORY OF PRESENT ILLNESS: Allison Silva is a 29 year old female with a history of multiple sclerosis. She returns today for follow-up. She is currently taking gilenya and states that this is working well for her. She denies any new numbness or weakness. No changes with her bowels or bladder. Denies any changes with her gait or balance. No changes with her vision. The patient continues to be very active and exercises daily. Patient states that she continues to have some mild cognitive changes that she feels is getting worse. She states that when someone is speaking to her if she doesn't look at them when they are talking its as if she doesn't even hear them. She feels that she has some earring loss on the right side. She states she is having a hard time remembering simple tasks. She states that she has been making a list but then will forget where the list is. She states she is unsure if its because of her busy schedule. She works as a Runner, broadcasting/film/video, a Psychologist, occupational and she has third job at a [employer].  She states that she had chest pain again about 1 week ago. She states that she forgot to get prilosec- Dr. Anne Hahn had advised her to try this.  In the past the patient has had 1 seizure episode. Since then the patient has not had any recurrent seizures. She states in October she had symptoms of appendicitis and went to the ED. She was diagnosed with a cyst on the left ovary but since then her pain has subsided. She did have a follow-up eye exam and she states that everything has remained stable. She does have "reddness" in the right eye but it has not gotten any worse.   HISTORY 08/17/13 (WILLIS): Allison Silva is a 29 year old right-handed white female with a history of multiple sclerosis. She has had a seizure event in the past as well. She reports  no further seizure episodes, and she is operating a motor vehicle. The patient has been switched to Gilenya, and she indicates that overall she is tolerating this well. Since she has been on the medication, she has been having episodes of some chest pain that occurs upon awakening in the morning. It is associated with a bruised-type feeling in the center of the chest that goes away as soon as she gets up. She had some palpitations of the heart within the first 2 weeks of starting the medication, but this is no longer a problem. She indicates that she is quite active, and she will run on a regular basis. She may have some transient numbness of the legs following her exercise, but this is not a persistent feature. No new events of numbness, weakness, bladder control problems, or visual problems have been noted. She returns for an evaluation. She does report some ongoing mild cognitive processing issues. This is unchanged.   REVIEW OF SYSTEMS: Out of a complete 14 system review of symptoms, the patient complains only of the following symptoms, and all other reviewed systems are negative.  He intolerance, hearing loss, memory loss, numbness  ALLERGIES: No Known Allergies  HOME MEDICATIONS: Outpatient Prescriptions Prior to Visit  Medication Sig Dispense Refill  . albuterol (PROVENTIL HFA;VENTOLIN HFA) 108 (90 BASE) MCG/ACT inhaler Inhale 2 puffs into the lungs every 6 (six) hours as needed for wheezing or shortness of breath.    Marland Kitchen  cholecalciferol (VITAMIN D) 1000 UNITS tablet Take 1,000 Units by mouth 2 (two) times daily.    Marland Kitchen GILENYA 0.5 MG CAPS Take 0.5 mg by mouth daily.     No facility-administered medications prior to visit.    PAST MEDICAL HISTORY: Past Medical History  Diagnosis Date  . Multiple sclerosis   . Seizures   . Asthma   . History of optic neuritis     Right    PAST SURGICAL HISTORY: No past surgical history on file.  FAMILY HISTORY: No family history on file.  SOCIAL  HISTORY: History   Social History  . Marital Status: Single    Spouse Name: N/A    Number of Children: 0  . Years of Education: BA   Occupational History  . TEACHER     [employer]   Social History Main Topics  . Smoking status: Never Smoker   . Smokeless tobacco: Never Used  . Alcohol Use: Yes     Comment: ONCE WEEKLY  . Drug Use: No  . Sexual Activity: Not on file   Other Topics Concern  . Not on file   Social History Narrative      PHYSICAL EXAM  Filed Vitals:   02/17/14 0844  BP: 111/65  Pulse: 73  Height:  (1.626 m)  Weight: 129 lb 3.2 oz (58.605 kg)   Body mass index is 22.17 kg/(m^2).  Generalized: Well developed, in no acute distress   Neurological examination  Mentation: Alert oriented to time, place, history taking. Follows all commands speech and language fluent Cranial nerve II-XII: Pupils were equal round reactive to light. Extraocular movements were full, visual field were full on confrontational test. Facial sensation and strength were normal. Uvula tongue midline. Head turning and shoulder shrug  were normal and symmetric. Motor: The motor testing reveals 5 over 5 strength of all 4 extremities. Good symmetric motor tone is noted throughout.  Sensory: Sensory testing is intact to soft touch on all 4 extremities. No evidence of extinction is noted.  Coordination: Cerebellar testing reveals good finger-nose-finger and heel-to-shin bilaterally.  Gait and station: Gait is normal. Tandem gait is normal. Romberg is negative. No drift is seen.  Reflexes: Deep tendon reflexes are symmetric and normal bilaterally.    DIAGNOSTIC DATA (LABS, IMAGING, TESTING) - I reviewed patient records, labs, notes, testing and imaging myself where available.  Lab Results  Component Value Date   WBC 2.2* 10/07/2013   HGB 10.7* 10/07/2013   HCT 33.5* 10/07/2013   MCV 87 10/07/2013   PLT 223 10/07/2013      Component Value Date/Time   NA 137  08/17/2013 0834   K 4.4 08/17/2013 0834   CL 102 08/17/2013 0834   CO2 28 08/17/2013 0834   GLUCOSE 85 08/17/2013 0834   BUN 15 08/17/2013 0834   CREATININE 0.99 08/17/2013 0834   CALCIUM 9.1 08/17/2013 0834   PROT 6.6 08/17/2013 0834   AST 19 08/17/2013 0834   ALT 13 08/17/2013 0834   ALKPHOS 40 08/17/2013 0834   BILITOT 0.3 08/17/2013 0834   GFRNONAA 79 08/17/2013 0834   GFRAA 92 08/17/2013 0834      ASSESSMENT AND PLAN 29 y.o. year old female  has a past medical history of Multiple sclerosis; Seizures; Asthma; and History of optic neuritis. here with:  1. Multiple Sclerosis 2. Seizure 3. Hearing loss right side   Overall the patient is doing well. She continues to take gilenya. I will check blood work today.  The patient did have an episode of chest pain that occurred roughly one week ago. She was advised at the last visit to begin taking Prilosec for acid reflux. She states that she forgot to do this but will try it. The patient feels that she's had some hearing loss on the right side, I will make a referral for audiology. The patient also feels that she has  mild cognitive changes that has progressed. Her MMSE is 29/30. We will continue to follow her memory over time. If she feels that her symptoms worsen or she develops new symptoms she should let us know. Otherwise she will follow-up in 6 months.  Butch Penny, MSN, NP-C 02/17/2014, 8:50 AM [facility] [address] [place], [place] [address] [phone]  Note: This document was prepared with digital dictation and possible smart phrase technology. Any transcriptional errors that result from this process are unintentional.

## 2014-02-17 NOTE — Progress Notes (Signed)
I have read the note, and I agree with the clinical assessment and plan.  Allison Silva,Allison Silva   

## 2014-02-17 NOTE — Patient Instructions (Signed)
Overall you are doing well. Continue Gilenya.  Will check blood work today.  Continue to exercise.  If you have new symptoms please let us know.

## 2014-02-18 LAB — CBC WITH DIFFERENTIAL
Basophils Absolute: 0 10*3/uL (ref 0.0–0.2)
Basos: 1 %
Eos: 3 %
Eosinophils Absolute: 0.1 10*3/uL (ref 0.0–0.4)
HCT: 37 % (ref 34.0–46.6)
HEMOGLOBIN: 11.7 g/dL (ref 11.1–15.9)
Immature Grans (Abs): 0 10*3/uL (ref 0.0–0.1)
Immature Granulocytes: 0 %
Lymphocytes Absolute: 0.2 10*3/uL — ABNORMAL LOW (ref 0.7–3.1)
Lymphs: 7 %
MCH: 28.7 pg (ref 26.6–33.0)
MCHC: 31.6 g/dL (ref 31.5–35.7)
MCV: 91 fL (ref 79–97)
MONOS ABS: 0.3 10*3/uL (ref 0.1–0.9)
Monocytes: 12 %
Neutrophils Absolute: 1.9 10*3/uL (ref 1.4–7.0)
Neutrophils Relative %: 77 %
Platelets: 218 10*3/uL (ref 150–379)
RBC: 4.07 x10E6/uL (ref 3.77–5.28)
RDW: 14.1 % (ref 12.3–15.4)
WBC: 2.5 10*3/uL — CL (ref 3.4–10.8)

## 2014-02-18 LAB — COMPREHENSIVE METABOLIC PANEL
A/G RATIO: 2.2 (ref 1.1–2.5)
ALBUMIN: 4.4 g/dL (ref 3.5–5.5)
ALT: 11 IU/L (ref 0–32)
AST: 14 IU/L (ref 0–40)
Alkaline Phosphatase: 34 IU/L — ABNORMAL LOW (ref 39–117)
BUN/Creatinine Ratio: 12 (ref 8–20)
BUN: 11 mg/dL (ref 6–20)
CO2: 25 mmol/L (ref 18–29)
Calcium: 9 mg/dL (ref 8.7–10.2)
Chloride: 104 mmol/L (ref 97–108)
Creatinine, Ser: 0.92 mg/dL (ref 0.57–1.00)
GFR calc Af Amer: 99 mL/min/{1.73_m2} (ref >59)
GFR, EST NON AFRICAN AMERICAN: 86 mL/min/{1.73_m2} (ref >59)
GLUCOSE: 80 mg/dL (ref 65–99)
Globulin, Total: 2 g/dL (ref 1.5–4.5)
Potassium: 4.6 mmol/L (ref 3.5–5.2)
Sodium: 142 mmol/L (ref 134–144)
Total Bilirubin: 0.3 mg/dL (ref 0.0–1.2)
Total Protein: 6.4 g/dL (ref 6.0–8.5)

## 2014-02-28 ENCOUNTER — Other Ambulatory Visit: Payer: Self-pay

## 2014-02-28 MED ORDER — FINGOLIMOD HCL 0.5 MG PO CAPS: 0.5000 mg | ORAL_CAPSULE | Freq: Every day | ORAL | Status: DC

## 2014-07-27 ENCOUNTER — Telehealth: Payer: Self-pay | Admitting: *Deleted

## 2014-07-27 NOTE — Telephone Encounter (Signed)
I called the patient. She needs to see a neurologist in [place], [place]. She is concerned that if she does not get in to see a neurologist there soon she will run our of her Gilenya. She currently has enough refills to last her until August.

## 2014-07-27 NOTE — Telephone Encounter (Signed)
I called the patient. She will be moving to [place] soon. I have asked her to look up MS doctors in that area, get a name and number for me, now make a referral. She will let me know.

## 2014-08-18 ENCOUNTER — Ambulatory Visit: Payer: BC Managed Care – PPO | Admitting: Adult Health

## 2015-04-09 ENCOUNTER — Other Ambulatory Visit: Payer: Self-pay | Admitting: Neurology

## 2015-04-10 ENCOUNTER — Telehealth: Payer: Self-pay | Admitting: *Deleted

## 2015-04-10 NOTE — Telephone Encounter (Signed)
Received request to r/f Gilenya.  LMOM that a 30 day supply has been sent in, but she  has not been seen since Jan. 2016, so will need an appt. prior to future r/f./fim
